# Patient Record
Sex: Female | Born: 1990 | Race: White | Hispanic: No | Marital: Single | Smoking: Never smoker
Health system: Southern US, Community
[De-identification: ages and names within clinical notes are randomized; demographics above are authoritative.]

## PROBLEM LIST (undated history)

## (undated) DIAGNOSIS — F32A Depression, unspecified: Secondary | ICD-10-CM

## (undated) DIAGNOSIS — T7840XA Allergy, unspecified, initial encounter: Secondary | ICD-10-CM

## (undated) DIAGNOSIS — F419 Anxiety disorder, unspecified: Secondary | ICD-10-CM

## (undated) DIAGNOSIS — J45909 Unspecified asthma, uncomplicated: Secondary | ICD-10-CM

## (undated) DIAGNOSIS — F329 Major depressive disorder, single episode, unspecified: Secondary | ICD-10-CM

## (undated) HISTORY — DX: Depression, unspecified: F32.A

## (undated) HISTORY — DX: Allergy, unspecified, initial encounter: T78.40XA

## (undated) HISTORY — DX: Anxiety disorder, unspecified: F41.9

## (undated) HISTORY — DX: Major depressive disorder, single episode, unspecified: F32.9

---

## 2016-07-14 ENCOUNTER — Emergency Department (HOSPITAL_COMMUNITY)
Admission: EM | Admit: 2016-07-14 | Discharge: 2016-07-14 | Disposition: A | Discharge status: Home / Self Care | Payer: 59 | Attending: Emergency Medicine | Admitting: Emergency Medicine

## 2016-07-14 ENCOUNTER — Emergency Department (HOSPITAL_COMMUNITY): Payer: 59

## 2016-07-14 ENCOUNTER — Encounter (HOSPITAL_COMMUNITY): Payer: Self-pay | Admitting: Emergency Medicine

## 2016-07-14 DIAGNOSIS — R05 Cough: Secondary | ICD-10-CM | POA: Insufficient documentation

## 2016-07-14 DIAGNOSIS — J45909 Unspecified asthma, uncomplicated: Secondary | ICD-10-CM | POA: Insufficient documentation

## 2016-07-14 DIAGNOSIS — J309 Allergic rhinitis, unspecified: Secondary | ICD-10-CM

## 2016-07-14 DIAGNOSIS — R059 Cough, unspecified: Secondary | ICD-10-CM

## 2016-07-14 HISTORY — DX: Unspecified asthma, uncomplicated: J45.909

## 2016-07-14 MED ORDER — OMEPRAZOLE 20 MG PO CPDR: 20.0000 mg | DELAYED_RELEASE_CAPSULE | Freq: Every day | ORAL | 0 refills | Status: DC

## 2016-07-14 MED ORDER — FLUTICASONE PROPIONATE 50 MCG/ACT NA SUSP: 2.0000 | Freq: Every day | NASAL | 0 refills | Status: AC

## 2016-07-14 MED ORDER — BENZONATATE 100 MG PO CAPS: 100.0000 mg | ORAL_CAPSULE | Freq: Three times a day (TID) | ORAL | 0 refills | Status: DC | PRN

## 2016-07-14 MED ORDER — CETIRIZINE HCL 10 MG PO TABS: 10.0000 mg | ORAL_TABLET | Freq: Every day | ORAL | 1 refills | Status: AC

## 2016-07-14 MED ORDER — BENZONATATE 100 MG PO CAPS
200.0000 mg | ORAL_CAPSULE | Freq: Once | ORAL | Status: AC
  Administered 2016-07-14: 200 mg via ORAL
  Filled 2016-07-14: qty 2

## 2016-07-14 NOTE — ED Triage Notes (Signed)
Pt. Stated, I've had a cough for over month, and I've been to 3 UC 's and no better.

## 2016-07-14 NOTE — ED Provider Notes (Signed)
[facility]EMERGENCY DEPT Provider Note   CSN: [id number] Arrival date & time: 07/14/16  1191     History   Chief Complaint Chief Complaint  Patient presents with  . Cough    HPI Brandi Li is a 25 y.o. female.  Brandi Li is a 25 y.o. Female who presents to the emergency department complaining of cough ongoing for the past month. Patient reports she's been to an urgent care 3 times in the past month with out relief of her cough. She reports initially she was having symptoms of sneezing, nasal congestion and postnasal drip. She reports these are still somewhat present but are much better than previously. She has finished 2 courses of azithromycin, she is currently on famotidine and has been on Allegra and Singulair for many years without changes. She denies any increased burping or belching recently. She denies any hemoptysis. No recent changes to her weight. No fevers. She denies any shortness of breath or wheezing. No history of any DVTs or PEs. She denies personal or close family history of any blood clotting disorders such as factor V Leiden, protein C or S deficiency. She denies fevers, chest pain, shortness of breath, wheezing, palpitations, abdominal pain, nausea, vomiting, hemoptysis, weight changes, night sweats, or rashes.    The history is provided by the patient. No language interpreter was used.  Cough  Associated symptoms include rhinorrhea. Pertinent negatives include no chest pain, no chills, no ear pain, no headaches, no sore throat, no shortness of breath and no wheezing.    Past Medical History:  Diagnosis Date  . Asthma     There are no active problems to display for this patient.   History reviewed. No pertinent surgical history.  OB History    No data available       Home Medications    Prior to Admission medications   Medication Sig Start Date End Date Taking? Authorizing Provider  fexofenadine (ALLEGRA) 180 MG tablet Take 180 mg by mouth  daily.   Yes Historical Provider, MD  montelukast (SINGULAIR) 10 MG tablet Take 10 mg by mouth at bedtime.   Yes Historical Provider, MD  PRESCRIPTION MEDICATION Take 1 tablet by mouth daily.   Yes Historical Provider, MD  sertraline (ZOLOFT) 100 MG tablet Take 100 mg by mouth daily.   Yes Historical Provider, MD  benzonatate (TESSALON) 100 MG capsule Take 1 capsule (100 mg total) by mouth 3 (three) times daily as needed for cough. 07/14/16   Everlene Farrier, PA-C  cetirizine (ZYRTEC ALLERGY) 10 MG tablet Take 1 tablet (10 mg total) by mouth daily. 07/14/16   Everlene Farrier, PA-C  fluticasone (FLONASE) 50 MCG/ACT nasal spray Place 2 sprays into both nostrils daily. 07/14/16   Everlene Farrier, PA-C  omeprazole (PRILOSEC) 20 MG capsule Take 1 capsule (20 mg total) by mouth daily. 07/14/16   Everlene Farrier, PA-C    Family History No family history on file.  Social History Social History  Substance Use Topics  . Smoking status: Never Smoker  . Smokeless tobacco: Never Used  . Alcohol use No     Allergies   Cephalosporins and Penicillins   Review of Systems Review of Systems  Constitutional: Negative for chills and fever.  HENT: Positive for congestion, postnasal drip, rhinorrhea and sneezing. Negative for ear pain, sinus pressure, sore throat and trouble swallowing.   Eyes: Negative for visual disturbance.  Respiratory: Positive for cough. Negative for chest tightness, shortness of breath and wheezing.   Cardiovascular: Negative for chest  pain and palpitations.  Gastrointestinal: Negative for abdominal pain, diarrhea, nausea and vomiting.  Genitourinary: Negative for dysuria.  Musculoskeletal: Negative for back pain and neck pain.  Skin: Negative for rash.  Neurological: Negative for light-headedness and headaches.     Physical Exam Updated Vital Signs BP 142/97   Pulse (!) 53   Temp 97.7 F (36.5 C) (Oral)   Resp 16   Ht 5\' 6"  (1.676 m)   Wt 123.1 kg   LMP 07/13/2016    SpO2 98%   BMI 43.80 kg/m   Physical Exam  Constitutional: She appears well-developed and well-nourished. No distress.  Nontoxic appearing. Obese female.  HENT:  Head: Normocephalic and atraumatic.  Right Ear: External ear normal.  Left Ear: External ear normal.  Mouth/Throat: Oropharynx is clear and moist. No oropharyngeal exudate.  Mild middle ear effusion noted bilaterally. No TM erythema or loss of landmarks.  No tonsillar hypertrophy or exudates. Uvula is midline without edema. No peritonsillar abscess. No trismus. No drooling. Boggy nasal turbinates bilaterally.  Eyes: Conjunctivae are normal. Pupils are equal, round, and reactive to light. Right eye exhibits no discharge. Left eye exhibits no discharge.  Neck: Normal range of motion. Neck supple. No JVD present. No tracheal deviation present.  Cardiovascular: Normal rate, regular rhythm, normal heart sounds and intact distal pulses.  Exam reveals no gallop and no friction rub.   No murmur heard. Pulmonary/Chest: Effort normal and breath sounds normal. No stridor. No respiratory distress. She has no wheezes. She has no rales. She exhibits no tenderness.  Lungs clear to auscultation bilaterally. Symmetric chest expansion bilaterally. No rales or rhonchi. No wheezing.  Abdominal: Soft. There is no tenderness.  Musculoskeletal: She exhibits no edema or tenderness.  No lower extremity edema or tenderness.  Lymphadenopathy:    She has no cervical adenopathy.  Neurological: She is alert. Coordination normal.  Skin: Skin is warm and dry. Capillary refill takes less than 2 seconds. No rash noted. She is not diaphoretic. No erythema. No pallor.  Psychiatric: She has a normal mood and affect. Her behavior is normal.  Nursing note and vitals reviewed.    ED Treatments / Results  Labs (all labs ordered are listed, but only abnormal results are displayed) Labs Reviewed - No data to display  EKG  EKG Interpretation None        Radiology Dg Chest 2 View  Result Date: 07/14/2016 CLINICAL DATA:  26 year old female with dry cough for the past month. Some shortness breath. History of asthma. Initial encounter. EXAM: CHEST  2 VIEW COMPARISON:  None. FINDINGS: The heart size and mediastinal contours are within normal limits. Both lungs are clear. The visualized skeletal structures are unremarkable. IMPRESSION: No active cardiopulmonary disease. Electronically Signed   By: Lacy Duverney M.D.   On: 07/14/2016 11:26    Procedures Procedures (including critical care time)  Medications Ordered in ED Medications  benzonatate (TESSALON) capsule 200 mg (200 mg Oral Given 07/14/16 1145)     Initial Impression / Assessment and Plan / ED Course  I have reviewed the triage vital signs and the nursing notes.  Pertinent labs & imaging results that were available during my care of the patient were reviewed by me and considered in my medical decision making (see chart for details).  Clinical Course    This is a 25 y.o. Female who presents to the emergency department complaining of cough ongoing for the past month. Patient reports she's been to an urgent care 3 times in  the past month with out relief of her cough. She reports initially she was having symptoms of sneezing, nasal congestion and postnasal drip. She reports these are still somewhat present but are much better than previously. She has finished 2 courses of azithromycin, she is currently on famotidine and has been on Allegra and Singulair for many years without changes. She denies any increased burping or belching recently. She denies any hemoptysis. No recent changes to her weight. No fevers. She denies any shortness of breath or wheezing. On exam patient is afebrile nontoxic appearing. She has a mild middle ear effusion noted bilaterally. No TM erythema or loss of landmarks. She is boggy nasal turbinates bilaterally. Throat is clear. Lungs are clear to auscultation  bilaterally. No wheezes. No rales or rhonchi. She is PERC negative. Low suspicion for PE. I suspect allergic rhinitis versus acid reflux. I obtained a chest x-ray due to the patient having a cough ongoing for 1 month. This was unremarkable. We'll treat the patient for allergic rhinitis and acid reflux. Will have her start Zyrtec, Flonase, Tessalon Perles and omeprazole. I encouraged her to discontinue Allegra. I advised that if her symptoms still persist over the next week she should follow-up with ENT Dr. Jearld FentonByers for further evaluation. I discussed return precautions. I advised the patient to follow-up with their primary care provider this week. I advised the patient to return to the emergency department with new or worsening symptoms or new concerns. The patient verbalized understanding and agreement with plan.      Final Clinical Impressions(s) / ED Diagnoses   Final diagnoses:  Cough  Allergic rhinitis, unspecified chronicity, unspecified seasonality, unspecified trigger    New Prescriptions Discharge Medication List as of 07/14/2016 11:59 AM    START taking these medications   Details  benzonatate (TESSALON) 100 MG capsule Take 1 capsule (100 mg total) by mouth 3 (three) times daily as needed for cough., Starting Sun 07/14/2016, Print    cetirizine (ZYRTEC ALLERGY) 10 MG tablet Take 1 tablet (10 mg total) by mouth daily., Starting Sun 07/14/2016, Print    fluticasone (FLONASE) 50 MCG/ACT nasal spray Place 2 sprays into both nostrils daily., Starting Sun 07/14/2016, Print    omeprazole (PRILOSEC) 20 MG capsule Take 1 capsule (20 mg total) by mouth daily., Starting Sun 07/14/2016, Print         Everlene FarrierWilliam Fredick Schlosser, PA-C 07/14/16 1212    Nira ConnPedro Eduardo Cardama, MD 07/15/16 [id number]

## 2016-12-05 ENCOUNTER — Ambulatory Visit (INDEPENDENT_AMBULATORY_CARE_PROVIDER_SITE_OTHER): Payer: BLUE CROSS/BLUE SHIELD | Admitting: Family Medicine

## 2016-12-05 ENCOUNTER — Encounter: Payer: Self-pay | Admitting: Family Medicine

## 2016-12-05 VITALS — BP 120/90 | HR 75 | Temp 97.6°F | Resp 12 | Ht 65.0 in | Wt 276.6 lb

## 2016-12-05 DIAGNOSIS — F419 Anxiety disorder, unspecified: Secondary | ICD-10-CM

## 2016-12-05 DIAGNOSIS — J069 Acute upper respiratory infection, unspecified: Secondary | ICD-10-CM | POA: Diagnosis not present

## 2016-12-05 DIAGNOSIS — J4520 Mild intermittent asthma, uncomplicated: Secondary | ICD-10-CM | POA: Diagnosis not present

## 2016-12-05 DIAGNOSIS — Z3041 Encounter for surveillance of contraceptive pills: Secondary | ICD-10-CM

## 2016-12-05 DIAGNOSIS — F418 Other specified anxiety disorders: Secondary | ICD-10-CM | POA: Diagnosis not present

## 2016-12-05 DIAGNOSIS — K219 Gastro-esophageal reflux disease without esophagitis: Secondary | ICD-10-CM

## 2016-12-05 DIAGNOSIS — F329 Major depressive disorder, single episode, unspecified: Secondary | ICD-10-CM | POA: Insufficient documentation

## 2016-12-05 DIAGNOSIS — Z309 Encounter for contraceptive management, unspecified: Secondary | ICD-10-CM | POA: Insufficient documentation

## 2016-12-05 DIAGNOSIS — F32A Depression, unspecified: Secondary | ICD-10-CM

## 2016-12-05 DIAGNOSIS — R030 Elevated blood-pressure reading, without diagnosis of hypertension: Secondary | ICD-10-CM | POA: Insufficient documentation

## 2016-12-05 MED ORDER — ALBUTEROL SULFATE HFA 108 (90 BASE) MCG/ACT IN AERS: 2.0000 | INHALATION_SPRAY | Freq: Four times a day (QID) | RESPIRATORY_TRACT | 1 refills | Status: DC | PRN

## 2016-12-05 MED ORDER — NORETHINDRONE-ETH ESTRADIOL 1-35 MG-MCG PO TABS: 1.0000 | ORAL_TABLET | Freq: Every day | ORAL | 1 refills | Status: DC

## 2016-12-05 MED ORDER — OMEPRAZOLE 20 MG PO CPDR: 20.0000 mg | DELAYED_RELEASE_CAPSULE | Freq: Every day | ORAL | 3 refills | Status: DC

## 2016-12-05 MED ORDER — MONTELUKAST SODIUM 10 MG PO TABS: 10.0000 mg | ORAL_TABLET | Freq: Every day | ORAL | 2 refills | Status: DC

## 2016-12-05 MED ORDER — PREDNISONE 20 MG PO TABS: 40.0000 mg | ORAL_TABLET | Freq: Every day | ORAL | 0 refills | Status: AC

## 2016-12-05 MED ORDER — BENZONATATE 100 MG PO CAPS: 200.0000 mg | ORAL_CAPSULE | Freq: Two times a day (BID) | ORAL | 0 refills | Status: AC | PRN

## 2016-12-05 MED ORDER — SERTRALINE HCL 100 MG PO TABS: 100.0000 mg | ORAL_TABLET | Freq: Every day | ORAL | 1 refills | Status: DC

## 2016-12-05 NOTE — Progress Notes (Signed)
HPI:   [name] is a 26 y.o. female, who is here today to establish care with me.  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 Previously Dr Brandi Li. Last preventive routine visit: 3-4 years ago.  Chronic medical problems: Asthma,allergic rhinitis,depression,anxiety, and GERD among some. Reports elevated BP readings for the past few months, she does not check BP at home.    Concerns today: Sertraline refill.  URI: 3 days of nasal congestion,rhinorrhea, non productive cough. She had sore throat and fever of 100 F x 1 days, both resolved. 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 + Intermittent wheezing with exertional dyspnea. She used Albuterol inh last night. In general she feels better.   Depression and anxiety: Dx 2-3 years ago. Currently she is on Sertraline 50 mg daily. According to pt,Sertraline was increased to 100 mg about a year ago, she called her former PCP and Sertraline 50 mg was called into her pharmacy. She has noted the difference. She feels frustrated, tired, frequently crying,and stress at work causes more anxiety than usual. She denies suicidal thoughts.  Medication side effects: None  Insomnia/sleep disorder Denies FHx: Sister with depression.  Lives with female roommate.   Asthma:  She is currently on Singulair 10 mg daily and Albuterol inh as needed. She has exacerbations with URI and with exercise.   Contraception: She is also requesting refill on her OCP's. She has been taking OCP's since age 82,initially recommended for heavy menses. She is sexually active. She has not had a pap smear. She is tolerating well,denies side effects.  -She is following a healthy diet , does not exercise regularly.  GERD: Heartburn improved with dietary changes.   Denies abdominal pain, nausea, vomiting, changes in bowel habits, blood in stool or melena.   Review of Systems  Constitutional: Positive for fatigue. Negative for  appetite change and chills.  HENT: Positive for congestion, postnasal drip and sinus pressure. Negative for mouth sores, sore throat, trouble swallowing and voice change.   Eyes: Negative for discharge and redness.  Respiratory: Positive for cough, shortness of breath and wheezing.   Cardiovascular: Negative for chest pain and palpitations.  Gastrointestinal: Negative for abdominal pain, diarrhea, nausea and vomiting.  Musculoskeletal: Negative for back pain, joint swelling, myalgias and neck pain.  Skin: Negative for rash.  Allergic/Immunologic: Positive for environmental allergies.  Neurological: Negative for syncope, weakness and headaches.  Hematological: Negative for adenopathy. Does not bruise/bleed easily.  Psychiatric/Behavioral: Negative for confusion and sleep disturbance. The patient is nervous/anxious.       Current Outpatient Prescriptions on File Prior to Visit  Medication Sig Dispense Refill  . cetirizine (ZYRTEC ALLERGY) 10 MG tablet Take 1 tablet (10 mg total) by mouth daily. 30 tablet 1  . fexofenadine (ALLEGRA) 180 MG tablet Take 180 mg by mouth daily.    . fluticasone (FLONASE) 50 MCG/ACT nasal spray Place 2 sprays into both nostrils daily. (Patient not taking: Reported on 12/05/2016) 16 g 0  . PRESCRIPTION MEDICATION Take 1 tablet by mouth daily.     No current facility-administered medications on file prior to visit.      Past Medical History:  Diagnosis Date  . Allergy   . Anxiety   . Asthma   . Depression    Allergies  Allergen Reactions  . Cephalosporins Rash  . Penicillins Rash    Family History  Problem Relation Age of Onset  . Cirrhosis Mother     Autoinmmune hepatitis  .  Hypertension Father   . Hyperlipidemia Father   . Mental illness Sister   . Diabetes Maternal Grandmother   . Hyperlipidemia Paternal Grandfather   . Hypertension Paternal Grandfather     Social History   Social History  . Marital status: Single    Spouse name: N/A    . Number of children: N/A  . Years of education: N/A   Social History Main Topics  . Smoking status: Never Smoker  . Smokeless tobacco: Never Used  . Alcohol use No  . Drug use: No  . Sexual activity: Yes    Birth control/ protection: Pill   Other Topics Concern  . None   Social History Narrative  . None    Vitals:   12/05/16 1353  BP: 120/90  Pulse: 75  Resp: 12  Temp: 97.6 F (36.4 C)    Body mass index is 46.03 kg/m.   Physical Exam  Nursing note and vitals reviewed. Constitutional: She is oriented to person, place, and time. She appears well-developed. She does not appear ill. No distress.  HENT:  Head: Atraumatic.  Right Ear: Tympanic membrane, external ear and ear canal normal.  Left Ear: Tympanic membrane, external ear and ear canal normal.  Nose: Rhinorrhea present. Right sinus exhibits no maxillary sinus tenderness and no frontal sinus tenderness. Left sinus exhibits no maxillary sinus tenderness and no frontal sinus tenderness.  Mouth/Throat: Oropharynx is clear and moist and mucous membranes are normal.  Eyes: Conjunctivae and EOM are normal. Pupils are equal, round, and reactive to light.  Neck: No muscular tenderness present.  Cardiovascular: Normal rate and regular rhythm.   No murmur heard. Pulses:      Dorsalis pedis pulses are 2+ on the right side, and 2+ on the left side.  Respiratory: Effort normal and breath sounds normal. No stridor. No respiratory distress.  GI: Soft. She exhibits no mass. There is no hepatomegaly. There is no tenderness.  Musculoskeletal: She exhibits no edema.  Lymphadenopathy:       Head (right side): No submandibular adenopathy present.       Head (left side): No submandibular adenopathy present.    She has no cervical adenopathy.  Neurological: She is alert and oriented to person, place, and time. She has normal strength. Coordination and gait normal.  Skin: Skin is warm. No rash noted. No erythema.  Psychiatric: She  has a normal mood and affect. Her speech is normal.  Well groomed, good eye contact.      ASSESSMENT AND PLAN:   Brandi Li was seen today for establish care.  Diagnoses and all orders for this visit:  Asthma, extrinsic, mild intermittent, uncomplicated  Today no wheezing on auscultation. Albuterol inh 2 puff every 6 hours for a week then as needed for wheezing or shortness of breath.  Prednisone x 3-5 days, some side effects discussed. Instructed about warning signs. Continue Singulair and Allegra. F/U in a year, before if needed.     - albuterol (PROVENTIL HFA;VENTOLIN HFA) 108 (90 Base) MCG/ACT inhaler; Inhale 2 puffs into the lungs every 6 (six) hours as needed for wheezing or shortness of breath. -     montelukast (SINGULAIR) 10 MG tablet; Take 1 tablet (10 mg total) by mouth at bedtime. -     predniSONE (DELTASONE) 20 MG tablet; Take 2 tablets (40 mg total) by mouth daily with breakfast.  URI, acute  Symptoms suggests a viral etiology, I explained patient that symptomatic treatment is usually recommended in this case, so I  do not think abx is needed at this time. Instructed to monitor for signs of complications, including new onset of fever among some, clearly instructed about warning signs. I also explained that cough and nasal congestion can last a few days and sometimes weeks. F/U as needed.   -     benzonatate (TESSALON) 100 MG capsule; Take 2 capsules (200 mg total) by mouth 2 (two) times daily as needed for cough.  Encounter for surveillance of contraceptive pills  Some side effects discussed. She needs a pap smear, recommend scheduling it with gyn or with me.  -     norethindrone-ethinyl estradiol 1/35 (ORTHO-NOVUM, NORTREL,CYCLAFEM) tablet; Take 1 tablet by mouth daily.  Anxiety and depression  Sertraline increased back to 100 mg. Some side effects discussed. Instructed about warning signs. F/U in 6 months.  -     sertraline (ZOLOFT) 100 MG tablet; Take 1  tablet (100 mg total) by mouth daily.  Gastroesophageal reflux disease, esophagitis presence not specified  GERD precautions discussed. Prednisone may aggravate symptoms,so PPI recommended.  -     omeprazole (PRILOSEC) 20 MG capsule; Take 1 capsule (20 mg total) by mouth daily.  Elevated blood pressure reading  DBP slightly elevated. Monitor BP at home. Low salt diet. Wt loss may help.     Dylen Mcelhannon G. Sw[place]Jordan, MD  [facility]. [place] office.

## 2016-12-05 NOTE — Patient Instructions (Signed)
A few things to remember from today's visit:   URI, acute - Plan: benzonatate (TESSALON) 100 MG capsule  Asthma, extrinsic, mild intermittent, uncomplicated - Plan: albuterol (PROVENTIL HFA;VENTOLIN HFA) 108 (90 Base) MCG/ACT inhaler, montelukast (SINGULAIR) 10 MG tablet, predniSONE (DELTASONE) 20 MG tablet  Encounter for surveillance of contraceptive pills - Plan: norethindrone-ethinyl estradiol 1/35 (ORTHO-NOVUM, NORTREL,CYCLAFEM) tablet  Anxiety and depression - Plan: sertraline (ZOLOFT) 100 MG tablet  Gastroesophageal reflux disease, esophagitis presence not specified    Avoid foods that make your symptoms worse, for example coffee, chocolate,pepermeint,alcohol, and greasy food. Raising the head of your bed about 6 inches may help with nocturnal symptoms.  Avoid tobacco use. Weight loss (if you are overweight). Avoid lying down for 3 hours after eating.  Instead 3 large meals daily try small and more frequent meals during the day.   You should be evaluated immediately if bloody vomiting, bloody stools, black stools (like tar), difficulty swallowing, food gets stuck on the way down or choking when eating. Abnormal weight loss or severe abdominal pain.    Please be sure medication list is accurate. If a new problem present, please set up appointment sooner than planned today.

## 2016-12-05 NOTE — Progress Notes (Signed)
Pre visit review using our clinic review tool, if applicable. No additional management support is needed unless otherwise documented below in the visit note. 

## 2016-12-06 ENCOUNTER — Other Ambulatory Visit: Payer: Self-pay

## 2016-12-06 ENCOUNTER — Telehealth: Payer: Self-pay

## 2016-12-06 MED ORDER — ALBUTEROL SULFATE HFA 108 (90 BASE) MCG/ACT IN AERS: 2.0000 | INHALATION_SPRAY | Freq: Four times a day (QID) | RESPIRATORY_TRACT | 1 refills | Status: DC | PRN

## 2016-12-06 NOTE — Telephone Encounter (Signed)
Patient called to report that this morning she took her medications as directed. She took Tessalon, Omeprazole and prednisone. She did eat a granola bar before taking medications. She reports that after about 20mins she began to feel very nauseous and vomited several times. She also reports that her mouth and throat began to feel numb, tight and swollen, she did seem to have some trouble breathing and swallowing. She drank some water and used her inhaler and did improve. She is not sure which medication may have caused the reaction as she took them all at the same time. Advised pt not to take any further medications until she receives recommendations from office.   Dr. Sw[place]il[place]Jordan - Please advise. Thanks!

## 2016-12-06 NOTE — Telephone Encounter (Signed)
She can stop Prednisone, this one could most likely cause GI irritation. Continue Omeprazole and Albuterol inh.  Thanks, BJ

## 2016-12-06 NOTE — Telephone Encounter (Signed)
Called and spoke with patient. Advised of MD's response and patient verbalized understanding. Also advised new Rx for Proair was sent to replace Proventil since that one isn't covered.

## 2016-12-07 ENCOUNTER — Encounter: Payer: Self-pay | Admitting: Family Medicine

## 2017-02-10 ENCOUNTER — Ambulatory Visit (INDEPENDENT_AMBULATORY_CARE_PROVIDER_SITE_OTHER): Payer: BLUE CROSS/BLUE SHIELD | Admitting: Family Medicine

## 2017-02-10 ENCOUNTER — Encounter: Payer: Self-pay | Admitting: Family Medicine

## 2017-02-10 VITALS — BP 130/88 | HR 92 | Temp 97.9°F | Ht 65.0 in | Wt 283.0 lb

## 2017-02-10 DIAGNOSIS — R1030 Lower abdominal pain, unspecified: Secondary | ICD-10-CM | POA: Diagnosis not present

## 2017-02-10 LAB — POC URINALSYSI DIPSTICK (AUTOMATED)
Bilirubin, UA: NEGATIVE
Glucose, UA: NEGATIVE
KETONES UA: NEGATIVE
Leukocytes, UA: NEGATIVE
Nitrite, UA: NEGATIVE
PROTEIN UA: NEGATIVE
RBC UA: NEGATIVE
SPEC GRAV UA: 1.02 (ref 1.010–1.025)
Urobilinogen, UA: 0.2 E.U./dL
pH, UA: 6 (ref 5.0–8.0)

## 2017-02-10 LAB — POCT URINE PREGNANCY: Preg Test, Ur: NEGATIVE

## 2017-02-10 NOTE — Patient Instructions (Signed)
Discussed your case with Dr. Sw[place]Jordan  She is ok with getting urine culture and only treating if positive. Should have this back by Thursday morning I would suspect  If persistent symptoms or worsening symptoms- you agreed to return to Dr. Sw[place]ilandJordan to consider pelvic exam and further workup. We need to see you back promptly if pain continues to worsen or fever

## 2017-02-10 NOTE — Progress Notes (Signed)
Subjective:  Brandi Li is a 26 y.o. year old very pleasant female patient who presents for/with See problem oriented charting ROS- no fever or chills. Some subjective warmth at times. No dysuria or polyuria. No chest pain or heart racing   Past Medical History-  Patient Active Problem List   Diagnosis Date Noted  . Asthma, extrinsic, mild intermittent, uncomplicated 12/05/2016  . Contraception management 12/05/2016  . Anxiety and depression 12/05/2016  . Gastroesophageal reflux disease 12/05/2016  . Elevated blood pressure reading 12/05/2016    Medications- reviewed and updated Current Outpatient Prescriptions  Medication Sig Dispense Refill  . albuterol (PROVENTIL HFA;VENTOLIN HFA) 108 (90 Base) MCG/ACT inhaler Inhale 2 puffs into the lungs every 6 (six) hours as needed for wheezing or shortness of breath. 1 Inhaler 1  . cetirizine (ZYRTEC ALLERGY) 10 MG tablet Take 1 tablet (10 mg total) by mouth daily. 30 tablet 1  . montelukast (SINGULAIR) 10 MG tablet Take 1 tablet (10 mg total) by mouth at bedtime. 90 tablet 2  . norethindrone-ethinyl estradiol 1/35 (ORTHO-NOVUM, NORTREL,CYCLAFEM) tablet Take 1 tablet by mouth daily. 3 Package 1  . sertraline (ZOLOFT) 100 MG tablet Take 1 tablet (100 mg total) by mouth daily. 90 tablet 1  . fluticasone (FLONASE) 50 MCG/ACT nasal spray Place 2 sprays into both nostrils daily. (Patient not taking: Reported on 12/05/2016) 16 g 0   No current facility-administered medications for this visit.     Objective: BP 130/88 (BP Location: Left Arm, Patient Position: Sitting, Cuff Size: Large)   Pulse (!) 104   Temp 97.9 F (36.6 C) (Oral)   Ht 5\' 5"  (1.651 m)   Wt 283 lb (128.4 kg)   LMP 01/27/2017   SpO2 96%   BMI 47.09 kg/m  Gen: NAD, resting comfortably CV: RRR no murmurs rubs or gallops Lungs: CTAB no crackles, wheeze, rhonchi Abdomen: soft/mild suprapubic tenderness/nondistended/normal bowel sounds. No rebound or guarding.  Ext: no  edema Skin: warm, dry  Results for orders placed or performed in visit on 02/10/17 (from the past 24 hour(s))  Pregnancy, urine     Status: None   Collection Time: 02/10/17 12:00 AM  Result Value Ref Range   Negative    POCT Urinalysis Dipstick (Automated)     Status: None   Collection Time: 02/10/17  4:50 PM  Result Value Ref Range   Color, UA yellow    Clarity, UA clear    Glucose, UA N    Bilirubin, UA N    Ketones, UA N    Spec Grav, UA 1.020 1.010 - 1.025   Blood, UA N    pH, UA 6.0 5.0 - 8.0   Protein, UA N    Urobilinogen, UA 0.2 0.2 or 1.0 E.U./dL   Nitrite, UA N    Leukocytes, UA Negative Negative    Assessment/Plan:  Lower abdominal pain - Plan: POCT Urinalysis Dipstick (Automated), Pregnancy, urine S: woke up this morning thinking she had held her urine more than she should but once she urinated continued to have low level discomfort which has worsened through the day slightly. No dysuria. Constant low level pressure but worse with walking or standing up. At worst about 3/10. No history of abdominal surgery. Denies polyuria. Nocturia once a night but not worsening.   Consistent use of birth control and not recently active so chance of pregnacny low. Last period beginning of may A/P: Initial UA reassuring. Given her symptoms get culture. Discussed pelvic exam which she declines- states she  will return to see her PCP on Thursday or Friday (to schedule today) and if not improving and culture negative will proceed with that exam. We also discussed potential of doing ultrasound or CT if workup unrevealing but were to have worsening pain. Discussed case with Dr. [place] as well- she is ok with holding on antibiotics until culture back.   Orders Placed This Encounter  Procedures  . Pregnancy, urine  . POCT Urinalysis Dipstick (Automated)   Return precautions advised- primarily prn Tana ConchStephen Victory Strollo, MD

## 2017-02-12 LAB — URINE CULTURE

## 2017-04-29 NOTE — Progress Notes (Signed)
HPI:   [name] is a 26 y.o. female, who is here today for her routine physical.  I last saw her on 12/05/16, since her last OV she was here for acute visit, c/o lower abdominal pain, resolved.   Regular exercise 3 or more time per week: Walking a few times per week for the past 2 months. Following a healthy diet: Trying to follow palio diet, not paying much attention to portions. She lives with  boyfriend.  Chronic medical problems: Asthma, allergies, anxiety,depression,and GERD.  Pap smear: Has not had one. Hx of STD's: Denies. She is sexually active, only one sex partner ever, She is on OCP's, tolerating well.  M:11 G:0 LMP 2 weeks ago.  She has would like LFT done because her mother has Hx of autoimmune hepatitis.She states that she usually has LFL with her routine check ups. Reporting FLP done in the past and not Hx of HLD.  She also needs form fill out, teacher physical form. Vaccines reported as up to date. Also needs TB screening test.    Review of Systems  Constitutional: Negative for appetite change, fatigue, fever and unexpected weight change.  HENT: Negative for dental problem, hearing loss, mouth sores, nosebleeds, trouble swallowing and voice change.   Eyes: Negative for pain and visual disturbance.  Respiratory: Negative for cough, shortness of breath and wheezing.   Cardiovascular: Negative for chest pain, palpitations and leg swelling.  Gastrointestinal: Negative for abdominal pain, blood in stool, nausea and vomiting.       No changes in bowel habits.  Endocrine: Negative for cold intolerance, heat intolerance, polydipsia, polyphagia and polyuria.  Genitourinary: Negative for decreased urine volume, dysuria, hematuria, menstrual problem, vaginal bleeding and vaginal discharge.       No breast tenderness or nipple discharge.  Musculoskeletal: Negative for gait problem and myalgias.  Skin: Negative for rash.  Allergic/Immunologic:  Positive for environmental allergies.  Neurological: Negative for syncope, weakness and headaches.  Hematological: Negative for adenopathy. Does not bruise/bleed easily.  Psychiatric/Behavioral: Negative for confusion and sleep disturbance. The patient is nervous/anxious.   All other systems reviewed and are negative.    Current Outpatient Prescriptions on File Prior to Visit  Medication Sig Dispense Refill  . albuterol (PROVENTIL HFA;VENTOLIN HFA) 108 (90 Base) MCG/ACT inhaler Inhale 2 puffs into the lungs every 6 (six) hours as needed for wheezing or shortness of breath. 1 Inhaler 1  . cetirizine (ZYRTEC ALLERGY) 10 MG tablet Take 1 tablet (10 mg total) by mouth daily. 30 tablet 1  . fluticasone (FLONASE) 50 MCG/ACT nasal spray Place 2 sprays into both nostrils daily. 16 g 0  . montelukast (SINGULAIR) 10 MG tablet Take 1 tablet (10 mg total) by mouth at bedtime. 90 tablet 2   No current facility-administered medications on file prior to visit.      Past Medical History:  Diagnosis Date  . Allergy   . Anxiety   . Asthma   . Depression    No past surgical history on file.  Allergies  Allergen Reactions  . Cephalosporins Rash  . Penicillins Rash    Family History  Problem Relation Age of Onset  . Cirrhosis Mother        Autoinmmune hepatitis  . Hypertension Father   . Hyperlipidemia Father   . Mental illness Sister   . Diabetes Maternal Grandmother   . Hyperlipidemia Paternal Grandfather   . Hypertension Paternal Grandfather     Social History   Social  History  . Marital status: Single    Spouse name: N/A  . Number of children: N/A  . Years of education: N/A   Social History Main Topics  . Smoking status: Never Smoker  . Smokeless tobacco: Never Used  . Alcohol use No  . Drug use: No  . Sexual activity: Yes    Birth control/ protection: Pill   Other Topics Concern  . None   Social History Narrative  . None    Vitals:   04/30/17 1143 04/30/17 1158    BP:  126/80  Pulse: 80 (!) 102   Body mass index is 47.3 kg/m.  O2 sat at RA 97%  Wt Readings from Last 3 Encounters:  04/30/17 284 lb 4 oz (128.9 kg)  02/10/17 283 lb (128.4 kg)  12/05/16 276 lb 9.6 oz (125.5 kg)    Physical Exam  Nursing note and vitals reviewed. Constitutional: She is oriented to person, place, and time. She appears well-developed. No distress.  HENT:  Head: Atraumatic.  Right Ear: Hearing, tympanic membrane, external ear and ear canal normal.  Left Ear: Hearing, tympanic membrane, external ear and ear canal normal.  Mouth/Throat: Uvula is midline, oropharynx is clear and moist and mucous membranes are normal.  Eyes: Pupils are equal, round, and reactive to light. Conjunctivae and EOM are normal.  Neck: No tracheal deviation present. No thyromegaly present.  Cardiovascular: Normal rate and regular rhythm.   No murmur heard. Pulses:      Dorsalis pedis pulses are 2+ on the right side, and 2+ on the left side.  Respiratory: Effort normal and breath sounds normal. No respiratory distress.  GI: Soft. She exhibits no mass. There is no hepatomegaly. There is no tenderness.  Genitourinary: No breast swelling or tenderness. There is no rash, tenderness or lesion on the right labia. There is no rash, tenderness or lesion on the left labia. Uterus is not deviated, not enlarged and not tender. Cervix exhibits discharge. Cervix exhibits no motion tenderness and no friability. Right adnexum displays no mass, no tenderness and no fullness. Left adnexum displays no mass, no tenderness and no fullness. No erythema, tenderness or bleeding in the vagina. Vaginal discharge found.  Genitourinary Comments: Breast: Bilateral inverted nipple , no masses,skin changes,or nipple discharge. Pap smear collected.  Musculoskeletal: She exhibits no edema.  No major deformity or signs of synovitis appreciated.  Lymphadenopathy:    She has no cervical adenopathy.       Right: No inguinal  and no supraclavicular adenopathy present.       Left: No inguinal and no supraclavicular adenopathy present.  Neurological: She is alert and oriented to person, place, and time. She has normal strength. No cranial nerve deficit. Coordination and gait normal.  Reflex Scores:      Bicep reflexes are 2+ on the right side and 2+ on the left side.      Patellar reflexes are 2+ on the right side and 2+ on the left side. Skin: Skin is warm. No rash noted. No erythema.  Psychiatric: Her mood appears anxious. Cognition and memory are normal.  Well groomed, good eye contact.     ASSESSMENT AND PLAN:   Brandi Li was seen today for annual exam and gynecologic exam.  Diagnoses and all orders for this visit:  Routine general medical examination at a health care facility   We discussed the importance of regular physical activity and healthy diet for prevention of chronic illness and/or complications. Preventive guidelines reviewed. Vaccination up to  date per pt report. She is not interested in STD screening, except for HIV. STD prevention and general safety/injury prevention discussed. Next CPE in 1-3 years.   Diabetes mellitus screening -     Comprehensive metabolic panel -     Hemoglobin A1c  Cervical cancer screening -     Cytology - PAP ([place])  Class 3 obesity without serious comorbidity with body mass index (BMI) of 45.0 to 49.9 in adult, unspecified obesity type (HCC)  We discussed benefits of wt loss as well as adverse effects of obesity. Consistency with healthy diet and physical activity recommended. Daily brisk walking for 15-30 min as tolerated/150 min of weekly moderate intensity exercise.  -     Comprehensive metabolic panel -     Hemoglobin A1c  Encounter for screening for HIV -     HIV antibody  Screening-pulmonary TB -     Quantiferon tb gold assay (blood)      I will see her back in 6 months to follow on wt and depression/anxiety,before if  needed.     Karey Stucki G. [place], MD  [facility]. [place] office.

## 2017-04-30 ENCOUNTER — Ambulatory Visit (INDEPENDENT_AMBULATORY_CARE_PROVIDER_SITE_OTHER): Payer: BLUE CROSS/BLUE SHIELD | Admitting: Family Medicine

## 2017-04-30 ENCOUNTER — Other Ambulatory Visit (HOSPITAL_COMMUNITY)
Admission: RE | Admit: 2017-04-30 | Discharge: 2017-04-30 | Disposition: A | Discharge status: Home / Self Care | Payer: BLUE CROSS/BLUE SHIELD | Source: Ambulatory Visit | Attending: Family Medicine | Admitting: Family Medicine

## 2017-04-30 ENCOUNTER — Encounter: Payer: Self-pay | Admitting: Family Medicine

## 2017-04-30 VITALS — BP 126/80 | HR 102 | Ht 65.0 in | Wt 284.2 lb

## 2017-04-30 DIAGNOSIS — Z0000 Encounter for general adult medical examination without abnormal findings: Secondary | ICD-10-CM | POA: Diagnosis not present

## 2017-04-30 DIAGNOSIS — Z111 Encounter for screening for respiratory tuberculosis: Secondary | ICD-10-CM

## 2017-04-30 DIAGNOSIS — Z01419 Encounter for gynecological examination (general) (routine) without abnormal findings: Secondary | ICD-10-CM | POA: Insufficient documentation

## 2017-04-30 DIAGNOSIS — Z124 Encounter for screening for malignant neoplasm of cervix: Secondary | ICD-10-CM | POA: Diagnosis not present

## 2017-04-30 DIAGNOSIS — F329 Major depressive disorder, single episode, unspecified: Secondary | ICD-10-CM

## 2017-04-30 DIAGNOSIS — Z114 Encounter for screening for human immunodeficiency virus [HIV]: Secondary | ICD-10-CM

## 2017-04-30 DIAGNOSIS — Z3041 Encounter for surveillance of contraceptive pills: Secondary | ICD-10-CM

## 2017-04-30 DIAGNOSIS — E669 Obesity, unspecified: Secondary | ICD-10-CM | POA: Diagnosis not present

## 2017-04-30 DIAGNOSIS — Z6841 Body Mass Index (BMI) 40.0 and over, adult: Secondary | ICD-10-CM | POA: Diagnosis not present

## 2017-04-30 DIAGNOSIS — F419 Anxiety disorder, unspecified: Secondary | ICD-10-CM

## 2017-04-30 DIAGNOSIS — IMO0001 Reserved for inherently not codable concepts without codable children: Secondary | ICD-10-CM | POA: Insufficient documentation

## 2017-04-30 DIAGNOSIS — F32A Depression, unspecified: Secondary | ICD-10-CM

## 2017-04-30 DIAGNOSIS — Z131 Encounter for screening for diabetes mellitus: Secondary | ICD-10-CM | POA: Diagnosis not present

## 2017-04-30 LAB — COMPREHENSIVE METABOLIC PANEL
ALT: 22 U/L (ref 0–35)
AST: 17 U/L (ref 0–37)
Albumin: 3.8 g/dL (ref 3.5–5.2)
Alkaline Phosphatase: 77 U/L (ref 39–117)
BUN: 10 mg/dL (ref 6–23)
CHLORIDE: 103 meq/L (ref 96–112)
CO2: 28 meq/L (ref 19–32)
CREATININE: 0.79 mg/dL (ref 0.40–1.20)
Calcium: 9.3 mg/dL (ref 8.4–10.5)
GFR: 95.33 mL/min (ref >60.00)
GLUCOSE: 100 mg/dL — AB (ref 70–99)
Potassium: 5.1 mEq/L (ref 3.5–5.1)
SODIUM: 137 meq/L (ref 135–145)
Total Bilirubin: 0.3 mg/dL (ref 0.2–1.2)
Total Protein: 6.9 g/dL (ref 6.0–8.3)

## 2017-04-30 LAB — HEMOGLOBIN A1C: Hgb A1c MFr Bld: 5.7 % (ref 4.6–6.5)

## 2017-04-30 MED ORDER — NORETHINDRONE-ETH ESTRADIOL 1-35 MG-MCG PO TABS: 1.0000 | ORAL_TABLET | Freq: Every day | ORAL | 3 refills | Status: DC

## 2017-04-30 MED ORDER — SERTRALINE HCL 100 MG PO TABS: 100.0000 mg | ORAL_TABLET | Freq: Every day | ORAL | 2 refills | Status: DC

## 2017-04-30 NOTE — Patient Instructions (Addendum)
A few things to remember from today's visit:   Routine general medical examination at a health care facility  Diabetes mellitus screening - Plan: Comprehensive metabolic panel, Hemoglobin A1c  Cervical cancer screening - Plan: Cytology - PAP ([place])  Encounter for screening for HIV - Plan: HIV antibody  Screening-pulmonary TB - Plan: Quantiferon tb gold assay (blood)    At least 150 minutes of moderate exercise per week, daily brisk walking for 15-30 min is a good exercise option. Healthy diet low in saturated (animal) fats and sweets and consisting of fresh fruits and vegetables, lean meats such as fish and white chicken and whole grains.   - Vaccines:  Tdap vaccine every 10 years.  Shingles vaccine recommended at age 24, could be given after 24 years of age but not sure about insurance coverage.  Pneumonia vaccines:  Prevnar 13 at 65 and Pneumovax at 66.  Screening recommendations for low/normal risk women:  Screening for diabetes at age 24-45 and every 3 years.  Cervical cancer prevention:  -HPV vaccination between 49-24 years old. -Pap smear starts at 24 years of age and continues periodically until 24 years old in low risk women. Pap smear every 3 years between 6721 and 24 years old. Pap smear every 3 years between women 30 and older if pap smear negative and HPV screening negative.   -Breast cancer: Mammogram: starting at 40.   Colon cancer screening: starts at 24 years old until 24 years old.  Cholesterol disorder screening at age 24 and every 3 years.  Also recommended:  1. Dental visit- Brush and floss your teeth twice daily; visit your dentist twice a year. 2. Eye doctor- Get an eye exam at least every 2 years. 3. Helmet use- Always wear a helmet when riding a bicycle, motorcycle, rollerblading or skateboarding. 4. Safe sex- If you may be exposed to sexually transmitted infections, use a condom. 5. Seat belts- Seat belts can save your live; always wear  one. 6. Smoke/Carbon Monoxide detectors- These detectors need to be installed on the appropriate level of your home. Replace batteries at least once a year. 7. Skin cancer- When out in the sun please cover up and use sunscreen 15 SPF or higher. 8. Violence- If anyone is threatening or hurting you, please tell your healthcare provider.  9. Drink alcohol in moderation- Limit alcohol intake to one drink or less per day. Never drink and drive.   Please be sure medication list is accurate. If a new problem present, please set up appointment sooner than planned today.

## 2017-05-01 LAB — CYTOLOGY - PAP: Diagnosis: NEGATIVE

## 2017-05-01 LAB — HIV ANTIBODY (ROUTINE TESTING W REFLEX): HIV: NONREACTIVE

## 2017-05-02 LAB — QUANTIFERON TB GOLD ASSAY (BLOOD)
Interferon Gamma Release Assay: NEGATIVE
Mitogen-Nil: 9.63 IU/mL
Quantiferon Nil Value: 0.02 IU/mL
Quantiferon Tb Ag Minus Nil Value: 0 IU/mL

## 2017-05-07 ENCOUNTER — Encounter: Payer: Self-pay | Admitting: Family Medicine

## 2017-06-12 ENCOUNTER — Encounter: Payer: Self-pay | Admitting: Family Medicine

## 2017-07-01 ENCOUNTER — Encounter: Payer: Self-pay | Admitting: Family Medicine

## 2017-07-01 ENCOUNTER — Ambulatory Visit (INDEPENDENT_AMBULATORY_CARE_PROVIDER_SITE_OTHER): Payer: BC Managed Care – PPO | Admitting: Family Medicine

## 2017-07-01 VITALS — BP 110/82 | HR 92 | Temp 98.2°F | Ht 65.0 in | Wt 288.6 lb

## 2017-07-01 DIAGNOSIS — R059 Cough, unspecified: Secondary | ICD-10-CM

## 2017-07-01 DIAGNOSIS — J4520 Mild intermittent asthma, uncomplicated: Secondary | ICD-10-CM

## 2017-07-01 DIAGNOSIS — R05 Cough: Secondary | ICD-10-CM

## 2017-07-01 MED ORDER — FLUTICASONE PROPIONATE HFA 44 MCG/ACT IN AERO
2.0000 | INHALATION_SPRAY | Freq: Two times a day (BID) | RESPIRATORY_TRACT | 0 refills | Status: DC | PRN
Start: 2017-07-01 — End: 2018-01-13

## 2017-07-01 MED ORDER — BENZONATATE 100 MG PO CAPS: 100.0000 mg | ORAL_CAPSULE | Freq: Three times a day (TID) | ORAL | 0 refills | Status: AC | PRN

## 2017-07-01 NOTE — Progress Notes (Signed)
HPI:   Cough -started: 1 month ago with mild cold -symptoms:cough -denies:fever, SOB, NVD, tooth pain, wheezing -has tried: on zyrtec and singulair for allergies/asthma -sick contacts/travel/risks: no reported flu, strep or tick exposure -Hx of: allergies, asthma - reports this happens every time she gets a cold - does not tolerate prednisone -not taking her flovent, has not seen allergist before  ROS: See pertinent positives and negatives per HPI.  Past Medical History:  Diagnosis Date  . Allergy   . Anxiety   . Asthma   . Depression     No past surgical history on file.  Family History  Problem Relation Age of Onset  . Cirrhosis Mother        Autoinmmune hepatitis  . Hypertension Father   . Hyperlipidemia Father   . Mental illness Sister   . Diabetes Maternal Grandmother   . Hyperlipidemia Paternal Grandfather   . Hypertension Paternal Grandfather     Social History   Social History  . Marital status: Single    Spouse name: N/A  . Number of children: N/A  . Years of education: N/A   Social History Main Topics  . Smoking status: Never Smoker  . Smokeless tobacco: Never Used  . Alcohol use No  . Drug use: No  . Sexual activity: Yes    Birth control/ protection: Pill   Other Topics Concern  . None   Social History Narrative  . None     Current Outpatient Prescriptions:  .  albuterol (PROVENTIL HFA;VENTOLIN HFA) 108 (90 Base) MCG/ACT inhaler, Inhale 2 puffs into the lungs every 6 (six) hours as needed for wheezing or shortness of breath., Disp: 1 Inhaler, Rfl: 1 .  cetirizine (ZYRTEC ALLERGY) 10 MG tablet, Take 1 tablet (10 mg total) by mouth daily., Disp: 30 tablet, Rfl: 1 .  fluticasone (FLONASE) 50 MCG/ACT nasal spray, Place 2 sprays into both nostrils daily., Disp: 16 g, Rfl: 0 .  montelukast (SINGULAIR) 10 MG tablet, Take 1 tablet (10 mg total) by mouth at bedtime., Disp: 90 tablet, Rfl: 2 .  norethindrone-ethinyl estradiol 1/35 (ORTHO-NOVUM,  NORTREL,CYCLAFEM) tablet, Take 1 tablet by mouth daily., Disp: 3 Package, Rfl: 3 .  sertraline (ZOLOFT) 100 MG tablet, Take 1 tablet (100 mg total) by mouth daily., Disp: 90 tablet, Rfl: 2 .  benzonatate (TESSALON PERLES) 100 MG capsule, Take 1 capsule (100 mg total) by mouth 3 (three) times daily as needed., Disp: 20 capsule, Rfl: 0 .  fluticasone (FLOVENT HFA) 44 MCG/ACT inhaler, Inhale 2 puffs into the lungs 2 (two) times daily as needed., Disp: 1 Inhaler, Rfl: 0  EXAM:  Vitals:   07/01/17 1638  BP: 110/82  Pulse: 92  Temp: 98.2 F (36.8 C)  SpO2: 98%    Body mass index is 48.03 kg/m.  GENERAL: vitals reviewed and listed above, alert, oriented, appears well hydrated and in no acute distress  HEENT: atraumatic, conjunttiva clear, no obvious abnormalities on inspection of external nose and ears, normal appearance of ear canals and TMs, clear nasal congestion, mild post oropharyngeal erythema with PND, no tonsillar edema or exudate, no sinus TTP  NECK: no obvious masses on inspection  LUNGS: clear to auscultation bilaterally, no wheezes, rales or rhonchi, good air movement  CV: HRRR, no peripheral edema  MS: moves all extremities without noticeable abnormality  PSYCH: pleasant and cooperative, no obvious depression or anxiety  ASSESSMENT AND PLAN:  Discussed the following assessment and plan:  Cough - Plan: DG Chest 2 View  Asthma, extrinsic, mild intermittent, uncomplicated  -given HPI and exam findings today, a serious infection or illness is unlikely. We discussed potential etiologies, with post viral cough/cough varient asthma being most likely -cxr to r/o other -she does not want to take oral steroid - start flovent -tessalon prn -consider allergy testing -follow up in 2 weeks - sooner if any worsening or concerns -of course, we advised to return or notify a doctor immediately if symptoms worsen or persist or new concerns arise.    Patient Instructions  BEFORE  YOU LEAVE: -follow up: 2 weeks  Flovent 2 puffs twice daily.  Continue other allergy meds.  Get the xray.  Tessalon as needed.  Follow up sooner if worsening or new concerns.   Kriste Basque R., DO

## 2017-07-01 NOTE — Patient Instructions (Signed)
BEFORE YOU LEAVE: -follow up: 2 weeks  Flovent 2 puffs twice daily.  Continue other allergy meds.  Get the xray.  Tessalon as needed.  Follow up sooner if worsening or new concerns.

## 2017-11-10 ENCOUNTER — Encounter: Payer: Self-pay | Admitting: Family Medicine

## 2017-11-10 ENCOUNTER — Ambulatory Visit: Payer: BC Managed Care – PPO | Admitting: Family Medicine

## 2017-11-10 VITALS — BP 133/82 | HR 113 | Temp 98.5°F | Resp 12 | Ht 65.0 in | Wt 282.0 lb

## 2017-11-10 DIAGNOSIS — F410 Panic disorder [episodic paroxysmal anxiety] without agoraphobia: Secondary | ICD-10-CM | POA: Diagnosis not present

## 2017-11-10 DIAGNOSIS — R233 Spontaneous ecchymoses: Secondary | ICD-10-CM | POA: Diagnosis not present

## 2017-11-10 DIAGNOSIS — F419 Anxiety disorder, unspecified: Secondary | ICD-10-CM | POA: Insufficient documentation

## 2017-11-10 MED ORDER — LORAZEPAM 0.5 MG PO TABS: 0.5000 mg | ORAL_TABLET | Freq: Every day | ORAL | 0 refills | Status: AC | PRN

## 2017-11-10 NOTE — Patient Instructions (Addendum)
A few things to remember from today's visit:   Petechial rash  Panic disorder without agoraphobia - Plan: LORazepam (ATIVAN) 0.5 MG tablet  We will monitor rash for now. Lorazepam daily as needed.  Please be sure medication list is accurate. If a new problem present, please set up appointment sooner than planned today.

## 2017-11-10 NOTE — Assessment & Plan Note (Signed)
She is reporting problem as stable, panic attacks have been seldom. We discussed different treatment options for acute anxiety, since she does not have these frequently and Lorazepam has helped in the past, I agree with small prescription (10 tabs).  We discussed some side effects of benzodiazepines in general. No changes in Zoloft 100 mg. She was clearly instructed about warning signs. Follow-up in 4 months, before if needed.

## 2017-11-10 NOTE — Progress Notes (Signed)
ACUTE VISIT   HPI:  Chief Complaint  Patient presents with  . Urticaria    on face, noticed this morning  . Panic Attack    once last night and once this morning    [name] is a 27 y.o. female, who is here today complaining of skin rash she noted this morning when she got up. She describes lesions as "hives", they are not pruritic. Constant rash and not changes since she noted it.  She has not started any new medication, detergent, soap, or body product. No known insect bite or outdoor exposures to plants.  No sick contact. No Hx of eczema or similar rash in the past.  OTC medication for this problem: None   She denies oral lesions/edema,cough, wheezing, dyspnea, abdominal pain, nausea, or vomiting.  She has history of allergic rhinitis and asthma, both problems stable overall.  Anxiety:  She is also complaining of having a "panic attack" last night. She has history of anxiety and depression, usually well controlled with Zoloft 100 mg daily.  She has been under a lot of stress and for the past 4 days she has felt overwhelmed.  Last night symptoms were worse, crying all night.  She denies suicidal thoughts.  She states that she has similar episodes very seldom, exacerbated by stress.  Last one she had before this one was about 2 years ago.  She has been on Lorazepam 0.5 mg daily as needed, which helped. She still has tablets left from old prescription,expired.  She follows with psychiatrist about 3-4 years ago. She has not seen psychotherapist.   Review of Systems  Constitutional: Positive for fatigue. Negative for appetite change, chills and fever.  HENT: Negative for congestion, mouth sores, nosebleeds, sneezing, sore throat, trouble swallowing and voice change.   Eyes: Negative for discharge and redness.  Respiratory: Negative for cough, shortness of breath and wheezing.   Cardiovascular: Negative for chest pain, palpitations and leg swelling.    Gastrointestinal: Negative for abdominal pain, diarrhea, nausea and vomiting.  Genitourinary: Negative for decreased urine volume and hematuria.  Musculoskeletal: Negative for arthralgias, gait problem and myalgias.  Skin: Positive for rash. Negative for wound.  Allergic/Immunologic: Positive for environmental allergies.  Neurological: Negative for syncope, weakness, numbness and headaches.  Hematological: Negative for adenopathy. Does not bruise/bleed easily.  Psychiatric/Behavioral: Negative for confusion. The patient is nervous/anxious.       Current Outpatient Medications on File Prior to Visit  Medication Sig Dispense Refill  . albuterol (PROVENTIL HFA;VENTOLIN HFA) 108 (90 Base) MCG/ACT inhaler Inhale 2 puffs into the lungs every 6 (six) hours as needed for wheezing or shortness of breath. (Patient taking differently: Inhale 2 puffs into the lungs as needed for wheezing or shortness of breath. ) 1 Inhaler 1  . cetirizine (ZYRTEC ALLERGY) 10 MG tablet Take 1 tablet (10 mg total) by mouth daily. 30 tablet 1  . fluticasone (FLONASE) 50 MCG/ACT nasal spray Place 2 sprays into both nostrils daily. 16 g 0  . fluticasone (FLOVENT HFA) 44 MCG/ACT inhaler Inhale 2 puffs into the lungs 2 (two) times daily as needed. 1 Inhaler 0  . montelukast (SINGULAIR) 10 MG tablet Take 1 tablet (10 mg total) by mouth at bedtime. 90 tablet 2  . norethindrone-ethinyl estradiol 1/35 (ORTHO-NOVUM, NORTREL,CYCLAFEM) tablet Take 1 tablet by mouth daily. 3 Package 3  . sertraline (ZOLOFT) 100 MG tablet Take 1 tablet (100 mg total) by mouth daily. 90 tablet 2  . benzonatate (TESSALON  PERLES) 100 MG capsule Take 1 capsule (100 mg total) by mouth 3 (three) times daily as needed. (Patient not taking: Reported on 11/10/2017) 20 capsule 0   No current facility-administered medications on file prior to visit.      Past Medical History:  Diagnosis Date  . Allergy   . Anxiety   . Asthma   . Depression    Allergies   Allergen Reactions  . Cephalosporins Rash  . Penicillins Rash    Social History   Socioeconomic History  . Marital status: Single    Spouse name: None  . Number of children: None  . Years of education: None  . Highest education level: None  Social Needs  . Financial resource strain: None  . Food insecurity - worry: None  . Food insecurity - inability: None  . Transportation needs - medical: None  . Transportation needs - non-medical: None  Occupational History  . None  Tobacco Use  . Smoking status: Never Smoker  . Smokeless tobacco: Never Used  Substance and Sexual Activity  . Alcohol use: No  . Drug use: No  . Sexual activity: Yes    Birth control/protection: Pill  Other Topics Concern  . None  Social History Narrative  . None    Vitals:   11/10/17 1214  BP: 133/82  Pulse: (!) 113  Resp: 12  Temp: 98.5 F (36.9 C)  SpO2: 98%   Body mass index is 46.93 kg/m.   Physical Exam  Nursing note and vitals reviewed. Constitutional: She is oriented to person, place, and time. She appears well-developed. No distress.  HENT:  Head: Normocephalic and atraumatic.  Mouth/Throat: Oropharynx is clear and moist and mucous membranes are normal.  Eyes: Conjunctivae are normal. Pupils are equal, round, and reactive to light.  Cardiovascular: Regular rhythm. Tachycardia present.  No murmur heard. Respiratory: Effort normal and breath sounds normal. No respiratory distress.  GI: Soft. She exhibits no mass. There is no hepatosplenomegaly. There is no tenderness.  Musculoskeletal: She exhibits no edema or tenderness.  Lymphadenopathy:    She has no cervical adenopathy.  Neurological: She is alert and oriented to person, place, and time.  Skin: Skin is warm. Petechiae and rash noted. Rash is not papular, not pustular and not vesicular.  Scattered petechiae rash on chin and cheeks. No tenderness or local heat. No lesions noted elsewhere.   Psychiatric: She has a normal  mood and affect. Her speech is normal.  Well groomed, good eye contact.    ASSESSMENT AND PLAN:   Ms. Brandi Li was seen today for urticaria and panic attack.  Diagnoses and all orders for this visit:  Petechial rash  Rash is not urticaria. Petechiae just localized on face (cheeks and chin) with no other associated symptoms. I think this was caused by crying last night,which she described as "bad" episode. I do not think blood work is needed at this time. She will monitor for new associated symptoms,new lesions, and instructed about warning signs. She voices understanding and agrees with plan.   Anxiety disorder She is reporting problem as stable, panic attacks have been seldom. We discussed different treatment options for acute anxiety, since she does not have these frequently and Lorazepam has helped in the past, I agree with small prescription (10 tabs).  We discussed some side effects of benzodiazepines in general. No changes in Zoloft 100 mg. She was clearly instructed about warning signs. Follow-up in 4 months, before if needed.    -Ms.Merri BrunetteMorgan Kiel was  advised to seek immediate medical attention if sudden worsening symptoms or to follow sooner if they persist or if new concerns arise.       Gaynor Ferreras G. [place], MD  [facility]. [place] office.

## 2017-12-25 ENCOUNTER — Other Ambulatory Visit: Payer: Self-pay | Admitting: Family Medicine

## 2017-12-25 DIAGNOSIS — F419 Anxiety disorder, unspecified: Principal | ICD-10-CM

## 2017-12-25 DIAGNOSIS — J4520 Mild intermittent asthma, uncomplicated: Secondary | ICD-10-CM

## 2017-12-25 DIAGNOSIS — F32A Depression, unspecified: Secondary | ICD-10-CM

## 2017-12-25 DIAGNOSIS — F329 Major depressive disorder, single episode, unspecified: Secondary | ICD-10-CM

## 2018-01-13 ENCOUNTER — Other Ambulatory Visit: Payer: Self-pay | Admitting: *Deleted

## 2018-01-13 MED ORDER — FLUTICASONE PROPIONATE HFA 44 MCG/ACT IN AERO: 2.0000 | INHALATION_SPRAY | Freq: Two times a day (BID) | RESPIRATORY_TRACT | 3 refills | Status: AC | PRN

## 2018-01-13 MED ORDER — ALBUTEROL SULFATE HFA 108 (90 BASE) MCG/ACT IN AERS: 2.0000 | INHALATION_SPRAY | RESPIRATORY_TRACT | 3 refills | Status: AC | PRN

## 2018-04-11 ENCOUNTER — Other Ambulatory Visit: Payer: Self-pay | Admitting: Family Medicine

## 2018-04-11 DIAGNOSIS — F419 Anxiety disorder, unspecified: Secondary | ICD-10-CM

## 2018-04-11 DIAGNOSIS — F329 Major depressive disorder, single episode, unspecified: Secondary | ICD-10-CM

## 2018-04-11 DIAGNOSIS — J4520 Mild intermittent asthma, uncomplicated: Secondary | ICD-10-CM

## 2018-05-05 ENCOUNTER — Other Ambulatory Visit: Payer: Self-pay | Admitting: Family Medicine

## 2018-05-05 DIAGNOSIS — F419 Anxiety disorder, unspecified: Principal | ICD-10-CM

## 2018-05-05 DIAGNOSIS — F329 Major depressive disorder, single episode, unspecified: Secondary | ICD-10-CM

## 2018-05-05 DIAGNOSIS — J4520 Mild intermittent asthma, uncomplicated: Secondary | ICD-10-CM

## 2018-05-06 NOTE — Telephone Encounter (Signed)
Tried calling patient, mailbox full, unable to leave message. Will try again at a later time.

## 2018-05-16 ENCOUNTER — Other Ambulatory Visit: Payer: Self-pay | Admitting: Family Medicine

## 2018-05-16 DIAGNOSIS — F329 Major depressive disorder, single episode, unspecified: Secondary | ICD-10-CM

## 2018-05-16 DIAGNOSIS — F419 Anxiety disorder, unspecified: Secondary | ICD-10-CM

## 2018-05-16 DIAGNOSIS — Z3041 Encounter for surveillance of contraceptive pills: Secondary | ICD-10-CM

## 2018-05-17 ENCOUNTER — Other Ambulatory Visit: Payer: Self-pay | Admitting: Family Medicine

## 2018-05-17 DIAGNOSIS — Z3041 Encounter for surveillance of contraceptive pills: Secondary | ICD-10-CM

## 2018-05-20 ENCOUNTER — Telehealth: Payer: Self-pay | Admitting: Family Medicine

## 2018-05-20 DIAGNOSIS — J4520 Mild intermittent asthma, uncomplicated: Secondary | ICD-10-CM

## 2018-05-20 MED ORDER — MONTELUKAST SODIUM 10 MG PO TABS: 10.0000 mg | ORAL_TABLET | Freq: Every day | ORAL | 0 refills | Status: AC

## 2018-05-20 MED ORDER — MONTELUKAST SODIUM 10 MG PO TABS: 10.0000 mg | ORAL_TABLET | Freq: Every day | ORAL | 0 refills | Status: DC

## 2018-05-20 NOTE — Telephone Encounter (Signed)
Copied from CRM [id number]. Topic: Quick Communication - Rx Refill/Question >> May 20, 2018  2:53 PM Mcneil, Ja-Kwan wrote: Medication: montelukast (SINGULAIR) 10 MG tablet  Has the patient contacted their pharmacy? yes   Preferred Pharmacy (with phone number or street name): [id number] - [place], [place] - [address] AT [facility] [address] [phone] (Phone) [phone] (Fax)  Agent: Please be advised that RX refills may take up to 3 business days. We ask that you follow-up with your pharmacy.

## 2018-06-24 ENCOUNTER — Other Ambulatory Visit: Payer: Self-pay | Admitting: Family Medicine

## 2018-06-24 DIAGNOSIS — F329 Major depressive disorder, single episode, unspecified: Secondary | ICD-10-CM

## 2018-06-24 DIAGNOSIS — F419 Anxiety disorder, unspecified: Principal | ICD-10-CM

## 2018-07-01 ENCOUNTER — Telehealth: Payer: Self-pay | Admitting: *Deleted

## 2018-07-01 DIAGNOSIS — F329 Major depressive disorder, single episode, unspecified: Secondary | ICD-10-CM

## 2018-07-01 DIAGNOSIS — F419 Anxiety disorder, unspecified: Principal | ICD-10-CM

## 2018-07-01 DIAGNOSIS — F32A Depression, unspecified: Secondary | ICD-10-CM

## 2018-07-01 NOTE — Telephone Encounter (Signed)
Copied from CRM [id number]. Topic: General - Other >> Jun 30, 2018  5:17 PM Mcneil, Ja-Kwan wrote: Reason for CRM: Pt states she moved out of the state to [place] and is currently in the process of finding a doctor but has yet to find one. Pt requests one more refill on the sertraline (ZOLOFT) 100 MG tablet while she tries to locate a doctor. Cb# [phone]

## 2018-07-01 NOTE — Telephone Encounter (Signed)
Message sent to Dr. Jordan for review and approval. 

## 2018-07-02 ENCOUNTER — Other Ambulatory Visit: Payer: Self-pay | Admitting: Family Medicine

## 2018-07-02 DIAGNOSIS — F329 Major depressive disorder, single episode, unspecified: Secondary | ICD-10-CM

## 2018-07-02 DIAGNOSIS — F419 Anxiety disorder, unspecified: Principal | ICD-10-CM

## 2018-07-02 NOTE — Telephone Encounter (Addendum)
She was instructed to establish with new PCP when last Rx was sent in 04/2018. She must establish with PCP in 1-2 weeks or start weaning medication off.  Please send Zoloft 100 mg # 15 to take 1/2 tab daily for 2 weeks then every other day.  Thanks, BJ

## 2018-07-02 NOTE — Telephone Encounter (Signed)
Patient is calling to check on the status of her medciation refill she has been off for 5 days.

## 2018-07-03 ENCOUNTER — Other Ambulatory Visit: Payer: Self-pay | Admitting: *Deleted

## 2018-07-03 DIAGNOSIS — F419 Anxiety disorder, unspecified: Principal | ICD-10-CM

## 2018-07-03 DIAGNOSIS — F329 Major depressive disorder, single episode, unspecified: Secondary | ICD-10-CM

## 2018-07-06 NOTE — Telephone Encounter (Signed)
Left message for patient to give clinic a call back concerning medications. 

## 2018-07-07 ENCOUNTER — Other Ambulatory Visit: Payer: Self-pay | Admitting: *Deleted

## 2018-07-07 DIAGNOSIS — F32A Depression, unspecified: Secondary | ICD-10-CM

## 2018-07-07 DIAGNOSIS — F419 Anxiety disorder, unspecified: Principal | ICD-10-CM

## 2018-07-07 DIAGNOSIS — F329 Major depressive disorder, single episode, unspecified: Secondary | ICD-10-CM

## 2018-07-07 NOTE — Telephone Encounter (Signed)
Please send in Rx for medication. Thanks

## 2018-07-08 MED ORDER — SERTRALINE HCL 100 MG PO TABS: 100.0000 mg | ORAL_TABLET | Freq: Every day | ORAL | 0 refills | Status: AC

## 2018-07-08 NOTE — Telephone Encounter (Signed)
Prescription for Zoloft 100 mg was sent to her pharmacy #30/0. This is the second refill I have authorized since she moved out of town and without appropriate follow-up. More refills will not be approved.   Almedia Cordell [place], MD

## 2018-08-06 IMAGING — CR DG CHEST 2V
2 series · 2 of 2 positions shown · non-contrast
Comparison: None.

CLINICAL DATA: 24-year-old female with dry cough for the past
month. Some shortness breath. History of asthma. Initial encounter.

EXAM:
CHEST  2 VIEW

[chest pa]
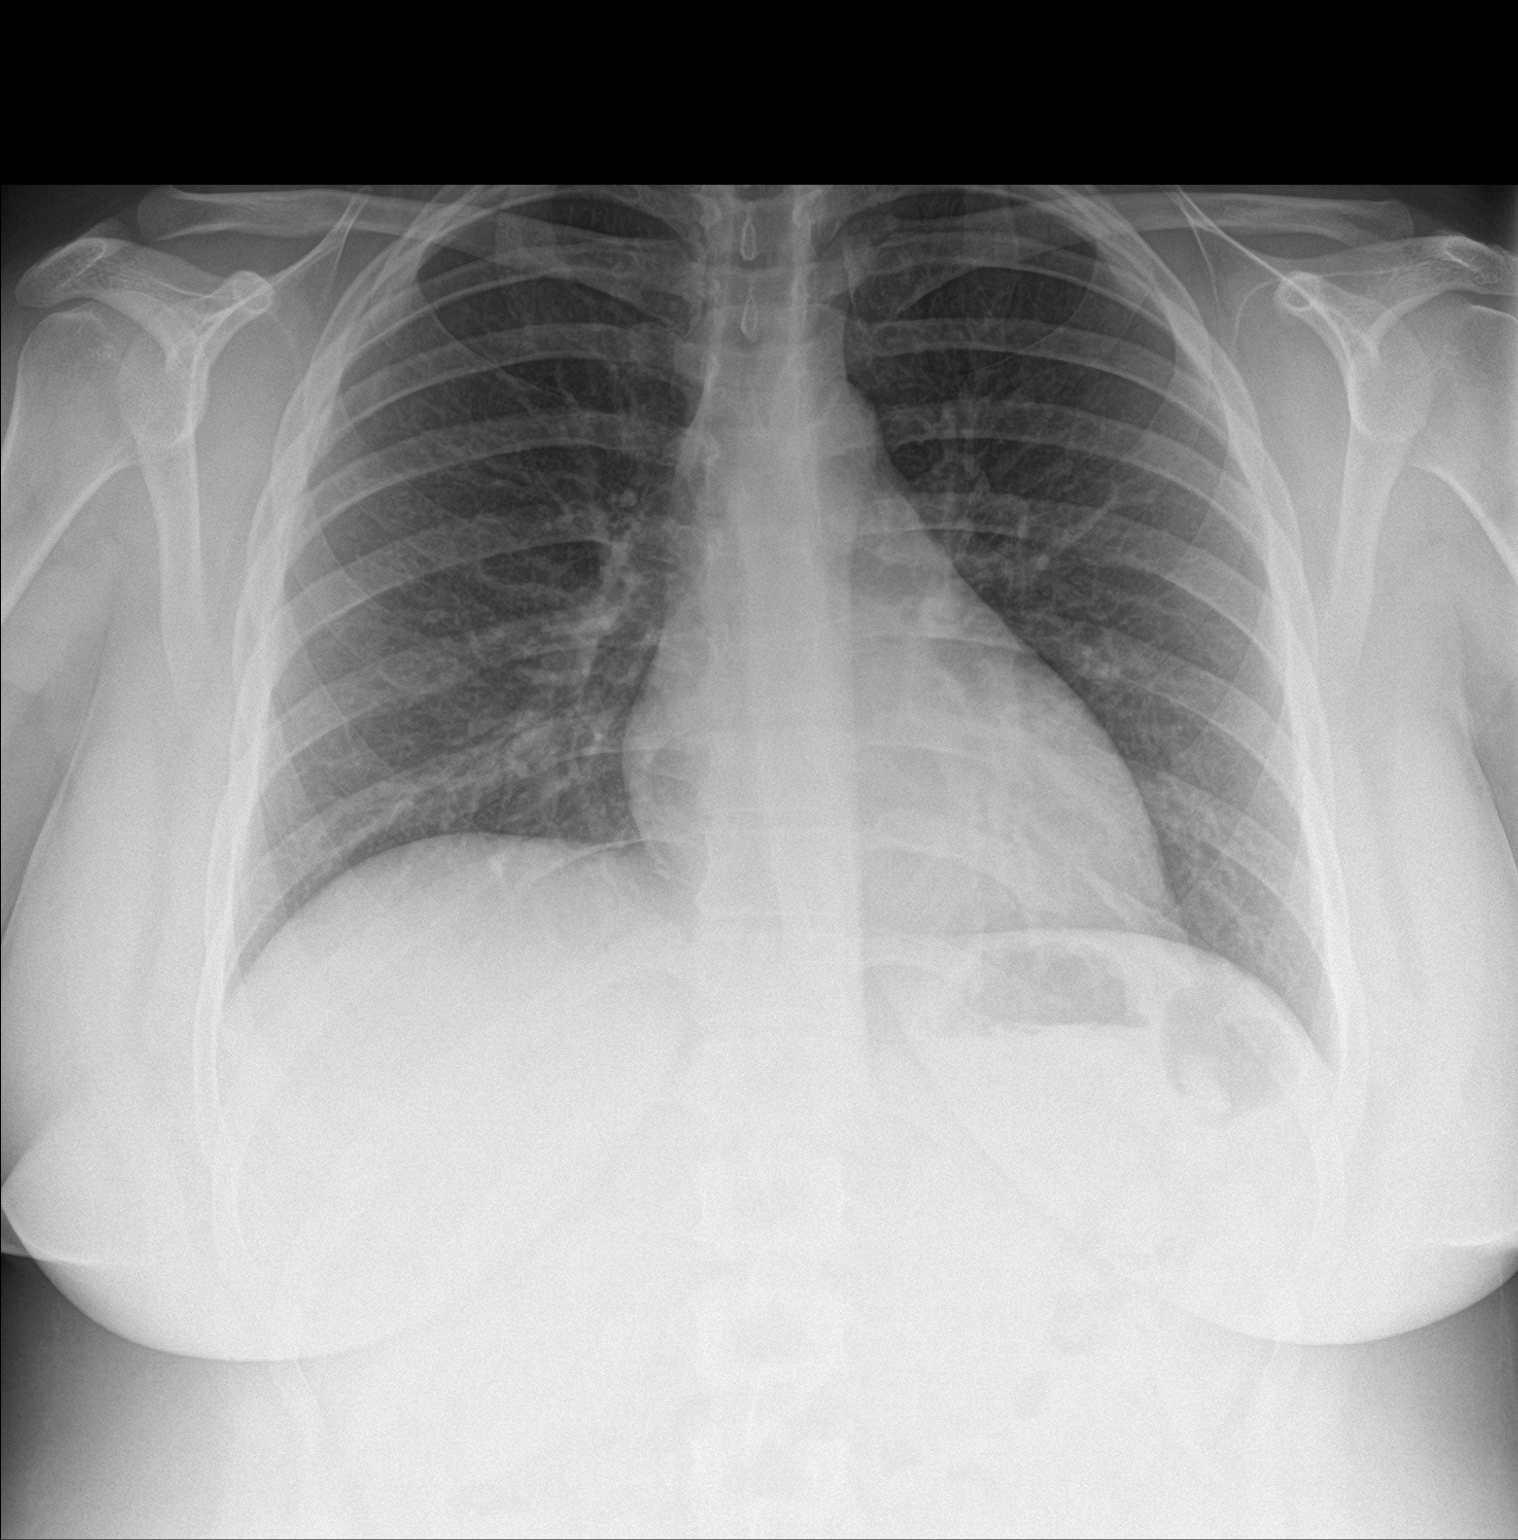

[chest lat]
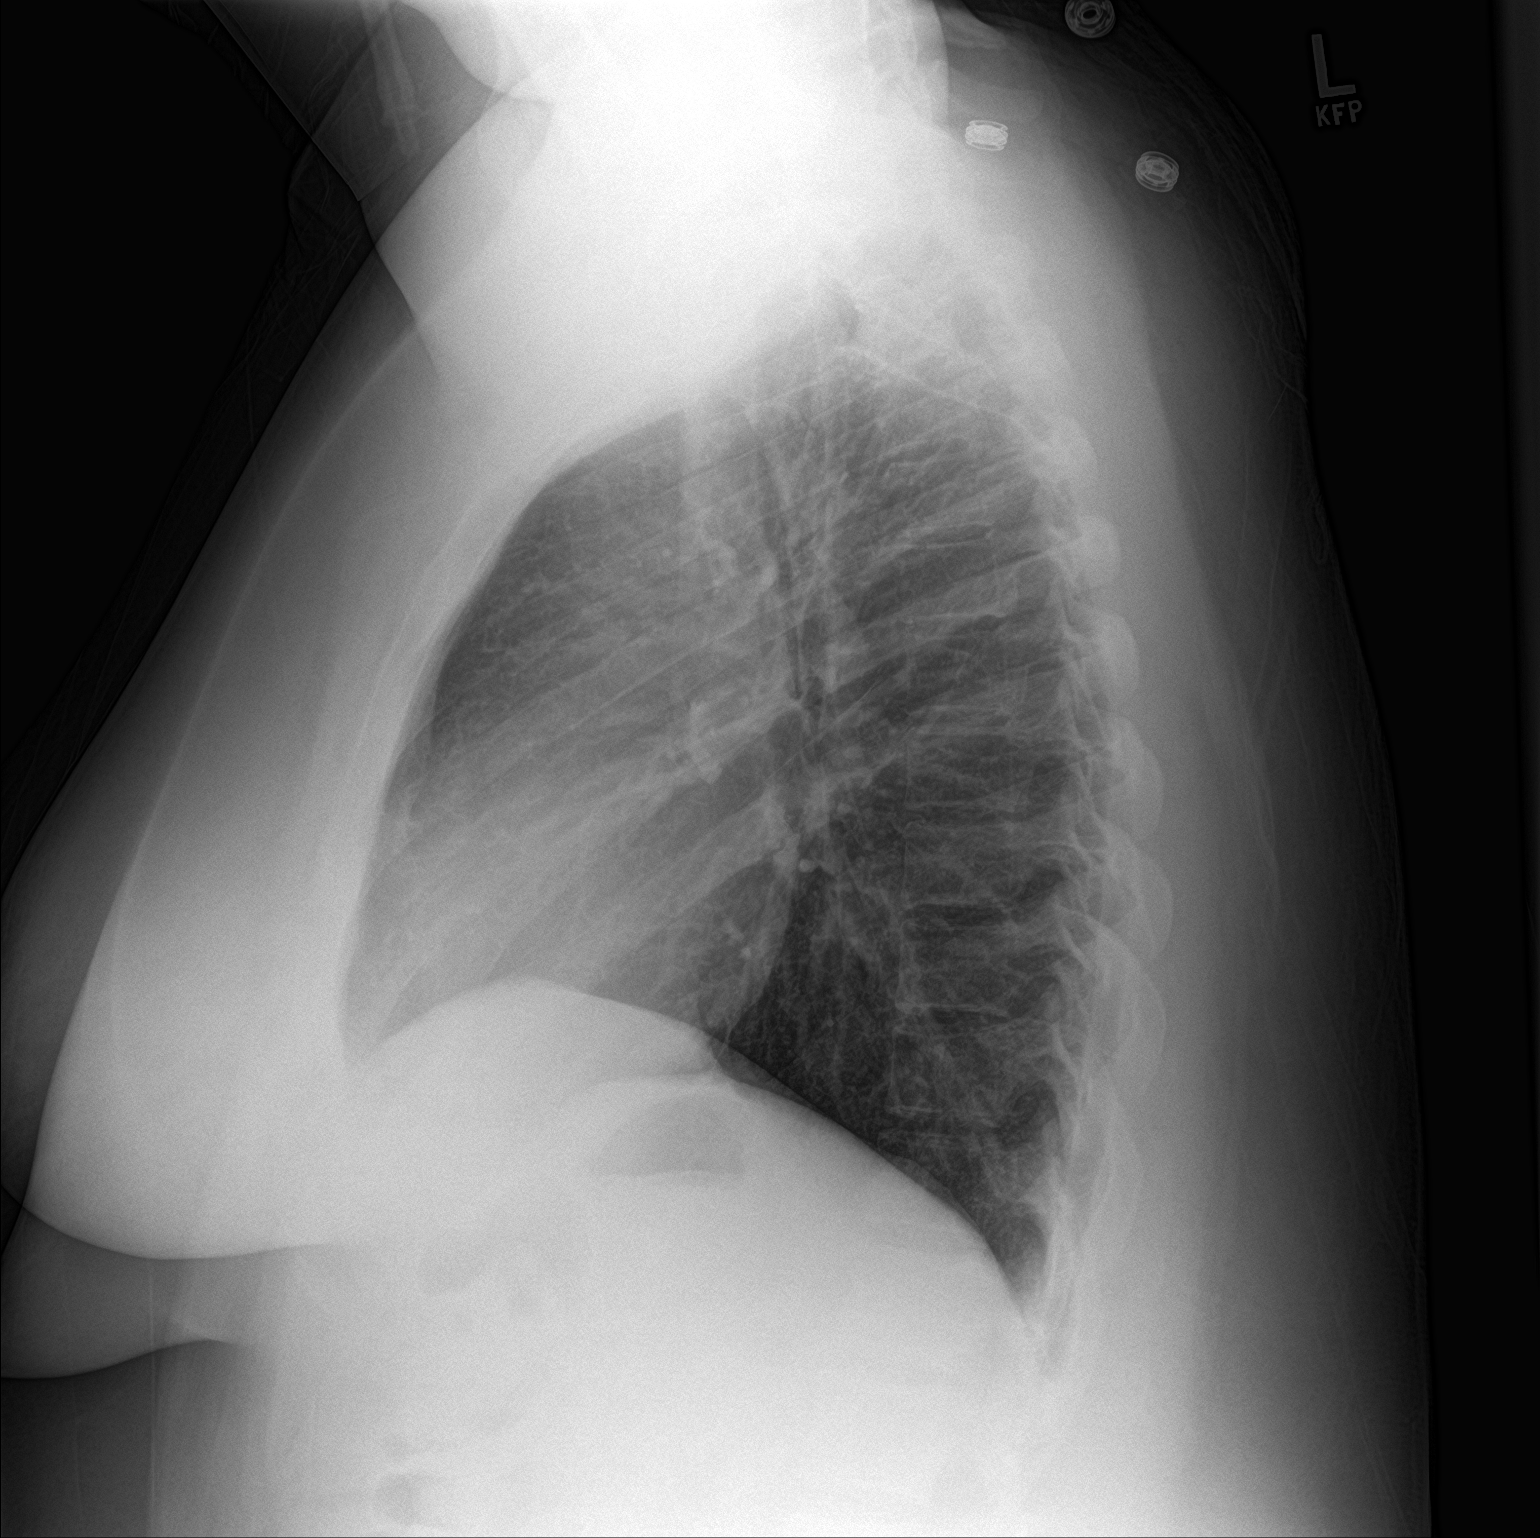

[2 of 2 positions shown; findings below may reference images not displayed]

FINDINGS: The heart size and mediastinal contours are within normal limits.
Both lungs are clear. The visualized skeletal structures are
unremarkable.
IMPRESSION: No active cardiopulmonary disease.

## 2021-01-09 ENCOUNTER — Ambulatory Visit

## 2021-02-07 ENCOUNTER — Ambulatory Visit

## 2021-02-07 ENCOUNTER — Ambulatory Visit: Admitting: Otolaryngology

## 2021-02-07 NOTE — Progress Notes (Signed)
 * * *    Foster, Debra **DOB:** 18-Oct-1990 (30 yo F) **Acc No.** [id number] **DOS:**  02/07/2021    ---        Frazier Foster, Debra Halt**    ------    18 Y old Female, DOB: January 05, 1991    Account Number: [id number]    [address], [place], [id number]    Home: [phone]    Guarantor: Debra Foster Insurance: [employer]/BS PPO Payer ID: [id number]    PCP: [name] Referring: [name]    Appointment Facility: [facility] [place]        * * *    02/07/2021    [place] New: Debra Repress, MD    ------    ---        **Current Medications**    ---    Taking      * Slynd     ---    * Wellbutrin     ---    * Vyvanse     ---    * sertraline     ---    * montelukast     ---    * Zyrtec     ---    Medication List reviewed and reconciled with the patient    ---      Past Medical History    ---      Medical History Verified.        ---      **Family History**    ---      No Family History documented.    ---      **Social History**    ---    no Smoking .    no Recreational drug use.    New since last visit: none.    no Alcohol.      **Allergies**    ---      penicillin    ---    cephalexin    ---    azithromycin    ---      **Review of Systems**    ---    _A detailed ROS was performed and is negative except for positives noted  below: (ROS sheet in office chart)._ :    Nasal congestion/obstruction yes. Sinus problems yes.            **Reason for Appointment**    ---      1\. Post nasal drip    ---      **History of Present Illness**    ---    __ :    30 year old female here today for evaluation of post nasal drip. Per patient  states has about 1 year of post nasal drip. States states even at times feels  so thick is hard to swallow and needs to force this down. Staates also a lot  of congestion but also just very dry in the nose. States both sides feel  blocked. States if she pushes nose up will breathe better. Nothing coming out  of the nose when she blows, becomes very bloody and crusty. Just feels  like  its dripping down the back of the throat. States she does get SCIT and  continues with antihistamine and montelukast. No productive cough. She does  have some pressure in the cheeks. No pain or pressure in the ears. States  maybe 1-2 times per year will get a sinusitis. She does have underlying  asthma. States did try flonase for a while, bu tno real  improvement. She does  use the arm and hammer saline irrigations. She does use this daily. No other  medicated nasal spray. No prior surgeries or trauma to the nose. She does feel  she is having some issues with swallowing as above--with the forceful  swallowing feels like can sometimes choke, and then everything feels tight in  the airway after for a little while. Does have intermittent GERD but stopped  omeprazole due to liver issue.      **Examination**    ---    _Constitutional:_    General Appearance:  No apparent distress, alert  .    _Orbits:_    EOM's  intact,  no spontaneous nystagmus  .    _Ear:_    Right:  Pinna without lesions. External auditory canal is clear without edema  or erythema. The tympanic membrane is intact without evidence of middle ear  effusion  . Left:  Pinna without lesions. External auditory canal is clear  without edema or erythema. The tympanic membrane is intact without evidence of  middle ear effusion  . Facial nerve:  Facial movement is intact and symmetric  .    _Nose:_    Speculum Exam  Anterior nasal cavity is clear. The caudal septal mucosa is  normal. The anterior face of the bilateral inferior turbinates is  unremarkable. No obvious purulence or polyps  .    _Oral cavity/Oropharynx:_    Lips:  No lesions  . Teeth:  intact dentition  . FOM:  without lesions  .  Tongue:  symmetric movement, no masses  . Palate:  no masses, symmetric  elevation, uvula midline  . Oropharynx:  no tonsil mass or lesion, no erythema  or exudate, posterior oropharyngeal wall mucosa without lesions  . Oral  vestibule:  no lesions  .     _Larynx/Hypopharynx:_    Fiberoptic examination performed  see below  . voice  no hoarseness  .    _Neck:_    Thyroid:  No palpable nodules  . Palpation:  No masses, adenopathy, non tender  .    _Skin of Head/Neck:_    :  No obvious lesions  .    _Cranial Nerve assessment:_    Nerves 3 - 12:  symmetric bilaterally  .          **Assessments**    ---    1\. Nasal congestion - R09.81 (Primary)    ---    2\. Deviated septum - J34.2    ---    3\. Post-nasal drip - R09.82    ---    4\. Facial pressure - R44.8    ---    5\. Dysphagia, unspecified type - R13.10    ---      30 year old female here today for evaluation of nasal congestion, post  nasal drip as well as dysphagia and throat tightness. No concerning findings  on exam today although she does have a deviated septum to the LEFT with near  complete obstruction. Otherwise normal nasal endoscopy and laryngoscopy.    In regards to her nasal congestion and post nasal drip--she has known  allergies which is obviously contributingto her symptoms and also discussed  the most likely anatomic obstruction from the septum that is also leading to  her difficulty breathing from the nose. She is maximized with both  montelukast, antihisatmines as well as SCIT, however will also add azelastin  spray daily to BID and risk of drowsiness was discussed. Also given the  persistent  symptoms with above level of treatment, will plan to evaluate  further for underlying sinusitis with CT sinus. I will see her back in 4-6  weeks to re-evaluate.    As for her dysphagia and throat tightness. Discussed possible causes such as  structural such as narrowing, stenosis or obstruction from intrinsic or  extrinsic mass as well as neurogenic sources. No obvious source on exam today.  Discussed post nasal drip as well as underlying GERD can lead to globus  sensatoin which may be contributing and will assess for improvement after  trial of azelastin spray. She would like to hold on GERD treatment for  now as  well as barium swallow, but can consider at follow up if no improvement. She  was encouraged to take slow deliberate sips/bites to avoid aspiration.    ALl of her questions were answered and she is comfortable with this plan. She  was encouraged to call if any new symptoms, questions, or concerns arise.  Warning signs for which to return were discussed. It was a pleasure seeing  Debra Foster today.    ---      **Treatment**    ---      **1\. Nasal congestion**    _IMAGING: CT scan: Sinus (thin section for use preop with [facility])_    Notes: -Azelastin spray BID    -Continue current regimen     -CT sinus     -Follow up in 4-6 weeks.     ---        **2\. Deviated septum**    Notes: -Azelastin spray BID    -Continue current regimen     -CT sinus     -Consider septoplasty     -Follow up in 4-6 weeks.         **3\. Post-nasal drip**    Start Azelastine Hydrochloride Nasal spray, 137 mcg/inh, 2 spray(s),  intranasally, 2 times a day, 30 day(s), 1 bottle, Refills 3    _IMAGING: CT scan: Sinus (thin section for use preop with [facility])_    Notes: -Azelastin spray BID    -Continue current regimen     -CT sinus     -Follow up in 4-6 weeks.         **4\. Facial pressure**    _IMAGING: CT scan: Sinus (thin section for use preop with [facility])_    Notes: -Azelastin spray BID    -Continue current regimen     -CT sinus     -Follow up in 4-6 weeks.         **5\. Dysphagia, unspecified type**    Notes: -Consider Famotidine    -Consider Barium swallow.       **Procedures**    ---    _AL_Sinonasal endoscopy_ :    Anesthesia  Indication:  Evaluate the interior of the nasal cavity, middle and  superior meatus, turbinates and spheno-ethmoid recess as well as nasopharyns.  Findings  Nasal cavity: septum deviated to the LEFT< near complete obstruction  the bilateral inferior turbinates and middle turbinates were unremarkable  without polyps or masses. The inferior and middle meati were clear without  obvious purulence or polyps. The  sphenoethmoidal recess was clear bilaterally.  Nasopharynx: clear without mass or lesion. The eustachian tube orifices were  clear bilaterally.  .    _Flexible laryngoscopy_ :    Indication:    Indications  Laryngoscopy performed to evaluate the upper aerodigestive tract.  Technique:  Flexible fiberoptic laryngoscopy performed under topical  anesthesia with neosynephrine/ lidocaine.  Scope  passed through the RIGHT/LEFT  nasal cavity to evaluate the nasal cavity, nasopharynx, oropharynx,  hypopharynx, supraglottis and glottis, the scope was passed to the the of the  glottis and then withdrawn without issue. Patient tolerated the procedure  well.  Findings:  No mass or lesion in the nasopharynx, oropharynx,  supraglottis, hypopharynx, or glottis. The eustachian tube orifices were  patent bilaterally. The true vocal folds were mobile and symmetric bilaterally  . Piriform sinus with mass or lesion.  Consent  The consent form was reviewed  with the patient and signed    .          **Procedure Codes**    ---      [id number] Sinus Endoscopy    ---      **Follow Up**    ---    4-6 Weeks    Electronically signed by Debra Foster , M.D. on 02/07/2021 at 04:24 PM EDT    Sign off status: Completed        * * *        [facility]    [address]    [address]    [place], [place] [address]    Tel: [phone]    Fax: [phone]              * * *          Progress Note: Debra Repress, MD 02/07/2021    ---    Note generated by eClinicalWorks EMR/PM Software ([email or web address])

## 2021-02-23 ENCOUNTER — Ambulatory Visit

## 2021-02-27 ENCOUNTER — Ambulatory Visit

## 2021-02-27 ENCOUNTER — Ambulatory Visit: Admitting: Otolaryngology

## 2021-02-27 NOTE — Progress Notes (Signed)
* * *      Langi, Gaylia **DOB:** Dec 18, 1990 (30 yo F) **Acc No.** [id number] **DOS:**  02/27/2021    ---        [name], [name]**    ------    41 Y old Female, DOB: February 23, 1991    [address] [place], [place], [place] [address]    Home: [phone]    Provider: Carron Curie        * * *    Telephone Encounter    ---    Answered by    Wilson Singer    Date: 02/27/2021        Time: 05:08 PM    Reason    Not ret calls    ------            Message                      Pt not returning calls please have her call [phone] to book CT. LTR sent /je                 Action Taken                      [employer] 02/28/2021 07:47:44 AM >                     * * *                ---          * * *        Provider: Carron Curie 02/27/2021    ---    Note generated by eClinicalWorks EMR/PM Software ([email or web address])

## 2021-04-02 ENCOUNTER — Ambulatory Visit

## 2021-04-05 ENCOUNTER — Ambulatory Visit: Admitting: Otolaryngology

## 2021-04-05 NOTE — Progress Notes (Signed)
* * *      Debra Foster, Debra Foster **DOB:** Apr 12, 1991 (30 yo F) **Acc No.** [id number] **DOS:**  04/05/2021    ---        [name], [name]**    ------    23 Y old Female, DOB: 02/21/1991    [address] [place], [place], [place] [address]    Home: [phone]    Provider: Carron Curie        * * *    Telephone Encounter    ---    Answered by    Carron Curie    Date: 04/05/2021        Time: 06:15 PM    Reason    CT results    ------            Message                      Can we let [name] know her CT sinus was normal, nothing concerning in regards to any underlying sinusitis.  She cancelled her follow up but happy to see her back if she would like to discuss anything futher.  Thanks                Action Taken                      Joseph,Praisey E 04/09/2021 12:02:19 PM > left message      Joseph,Praisey E 04/10/2021 08:44:13 AM > left message      Joseph,Praisey E 04/11/2021 09:46:17 AM > left message      SIM,MILLY  04/12/2021 12:04:14 PM > pt cb and received message      Joseph,Praisey E 04/12/2021 01:04:49 PM >spoke to pt- she is aware of results and have scheduled follow up                    * * *                ---          * * *        Provider: Carron Curie 04/05/2021    ---    Note generated by eClinicalWorks EMR/PM Software ([email or web address])
